# Patient Record
Sex: Male | Born: 1975 | Race: White | Hispanic: No | Marital: Married | Smoking: Never smoker
Health system: Southern US, Community
[De-identification: ages and names within clinical notes are randomized; demographics above are authoritative.]

## PROBLEM LIST (undated history)

## (undated) DIAGNOSIS — J45909 Unspecified asthma, uncomplicated: Secondary | ICD-10-CM

---

## 2017-04-24 ENCOUNTER — Other Ambulatory Visit: Payer: Self-pay

## 2017-04-24 ENCOUNTER — Emergency Department (HOSPITAL_BASED_OUTPATIENT_CLINIC_OR_DEPARTMENT_OTHER): Payer: 59

## 2017-04-24 ENCOUNTER — Emergency Department (HOSPITAL_BASED_OUTPATIENT_CLINIC_OR_DEPARTMENT_OTHER)
Admission: EM | Admit: 2017-04-24 | Discharge: 2017-04-24 | Disposition: A | Discharge status: Home / Self Care | Payer: 59 | Attending: Emergency Medicine | Admitting: Emergency Medicine

## 2017-04-24 ENCOUNTER — Encounter (HOSPITAL_BASED_OUTPATIENT_CLINIC_OR_DEPARTMENT_OTHER): Payer: Self-pay | Admitting: Adult Health

## 2017-04-24 DIAGNOSIS — R0602 Shortness of breath: Secondary | ICD-10-CM | POA: Diagnosis not present

## 2017-04-24 DIAGNOSIS — M791 Myalgia, unspecified site: Secondary | ICD-10-CM | POA: Insufficient documentation

## 2017-04-24 DIAGNOSIS — R112 Nausea with vomiting, unspecified: Secondary | ICD-10-CM | POA: Diagnosis not present

## 2017-04-24 DIAGNOSIS — B9789 Other viral agents as the cause of diseases classified elsewhere: Secondary | ICD-10-CM | POA: Diagnosis not present

## 2017-04-24 DIAGNOSIS — J45909 Unspecified asthma, uncomplicated: Secondary | ICD-10-CM | POA: Insufficient documentation

## 2017-04-24 DIAGNOSIS — R05 Cough: Secondary | ICD-10-CM | POA: Diagnosis present

## 2017-04-24 DIAGNOSIS — F17228 Nicotine dependence, chewing tobacco, with other nicotine-induced disorders: Secondary | ICD-10-CM | POA: Insufficient documentation

## 2017-04-24 DIAGNOSIS — J069 Acute upper respiratory infection, unspecified: Secondary | ICD-10-CM | POA: Insufficient documentation

## 2017-04-24 DIAGNOSIS — R21 Rash and other nonspecific skin eruption: Secondary | ICD-10-CM | POA: Insufficient documentation

## 2017-04-24 DIAGNOSIS — B354 Tinea corporis: Secondary | ICD-10-CM | POA: Diagnosis not present

## 2017-04-24 DIAGNOSIS — R0981 Nasal congestion: Secondary | ICD-10-CM | POA: Diagnosis not present

## 2017-04-24 HISTORY — DX: Unspecified asthma, uncomplicated: J45.909

## 2017-04-24 LAB — BASIC METABOLIC PANEL
ANION GAP: 8 (ref 5–15)
BUN: 8 mg/dL (ref 6–20)
CALCIUM: 8.8 mg/dL — AB (ref 8.9–10.3)
CO2: 26 mmol/L (ref 22–32)
Chloride: 104 mmol/L (ref 101–111)
Creatinine, Ser: 1.02 mg/dL (ref 0.61–1.24)
GFR calc non Af Amer: >60 mL/min (ref >60)
Glucose, Bld: 101 mg/dL — ABNORMAL HIGH (ref 65–99)
Potassium: 3.9 mmol/L (ref 3.5–5.1)
SODIUM: 138 mmol/L (ref 135–145)

## 2017-04-24 LAB — CBC
HCT: 44.7 % (ref 39.0–52.0)
Hemoglobin: 15.9 g/dL (ref 13.0–17.0)
MCH: 30.1 pg (ref 26.0–34.0)
MCHC: 35.6 g/dL (ref 30.0–36.0)
MCV: 84.7 fL (ref 78.0–100.0)
Platelets: 281 10*3/uL (ref 150–400)
RBC: 5.28 MIL/uL (ref 4.22–5.81)
RDW: 12.5 % (ref 11.5–15.5)
WBC: 11.2 10*3/uL — AB (ref 4.0–10.5)

## 2017-04-24 LAB — TROPONIN I: Troponin I: <0.03 ng/mL (ref <0.03)

## 2017-04-24 MED ORDER — ALBUTEROL SULFATE HFA 108 (90 BASE) MCG/ACT IN AERS: 2.0000 | INHALATION_SPRAY | RESPIRATORY_TRACT | 0 refills | Status: AC | PRN

## 2017-04-24 MED ORDER — ALBUTEROL SULFATE (2.5 MG/3ML) 0.083% IN NEBU
5.0000 mg | INHALATION_SOLUTION | Freq: Once | RESPIRATORY_TRACT | Status: AC
  Administered 2017-04-24: 5 mg via RESPIRATORY_TRACT
  Filled 2017-04-24: qty 6

## 2017-04-24 MED ORDER — ACETAMINOPHEN 500 MG PO TABS
1000.0000 mg | ORAL_TABLET | Freq: Once | ORAL | Status: AC
  Administered 2017-04-24: 1000 mg via ORAL
  Filled 2017-04-24: qty 2

## 2017-04-24 MED ORDER — CLOTRIMAZOLE 1 % EX CREA: TOPICAL_CREAM | CUTANEOUS | 0 refills | Status: AC

## 2017-04-24 MED ORDER — SODIUM CHLORIDE 0.9 % IV BOLUS (SEPSIS)
1000.0000 mL | Freq: Once | INTRAVENOUS | Status: AC
  Administered 2017-04-24: 1000 mL via INTRAVENOUS

## 2017-04-24 NOTE — Discharge Instructions (Addendum)
Your evaluation today has been reassuring, you may continue to use albuterol as needed for shortness of breath.  You likely have a viral upper respiratory infection, you can treat this symptomatically, recommend using Zyrtec and Flonase to help with nasal congestion.  Make sure you are drinking plenty of fluids.  If you develop persistent fevers, chest pain or shortness of breath, persistent vomiting or other new or concerning symptoms please return to the ED.  Otherwise please use the phone number provided at the end of your paperwork to establish care with a primary doctor, who can follow-up with if symptoms are not resolving after 5-7 days.  Please also follow-up with this primary doctor regarding your blood pressure as it has been slightly elevated throughout your visit today.  Your rash is likely ringworm, you can try applying Lotrimin twice daily, if this is making the rash worse or is not improving please follow-up with primary care.

## 2017-04-24 NOTE — ED Provider Notes (Signed)
[facility] EMERGENCY DEPARTMENT Provider Note   CSN: [id number] Arrival date & time: 04/24/17  1126     History   Chief Complaint Chief Complaint  Patient presents with  . Cough    HPI  Sean Singh is a 42 y.o. Male history of asthma, presents to the ED complaining of cough, congestion, body aches, nausea, vomiting and sinus pressure.  Reports cough once again became productive of lots of phlegm, no blood in the sputum.  Reports he had one nosebleed on Wednesday but none since then.  Patient reports he initially had the symptoms about 3 weeks ago, and everything but the cough improved until this past Wednesday when patient began to experience the symptoms again.  Patient denies fevers or chills.  Reports emesis is typically after a coughing spell, patient denies abdominal pain, diarrhea or blood in the stool.  He reports despite occasional vomiting he has been able to keep down fluid and fluids.  He denies any chest pain, or pain with deep breaths, but patient does report since Wednesday he has been short of breath, especially with exertion like when he is trying to do something or walk up a set of stairs.  Patient denies any syncope, lightheadedness or dizziness.  Wife also notes that blood pressure has been elevated recently.  Patient does have history of asthma, but has not had to use an inhaler received treatment for this since he was a child.  No history of smoking. Denies lower extremity pain or swelling, recent travel or immobilization, history of PE or DVT, family or personal history of bleeding or clotting disorders or hemoptysis.  Patient also reports he has had a rash for several months, and initially started over his right shoulder, with several small circular lesions, it has now spread across to the other shoulder with some lesions on his arm as well.  Patient reports he initially saw a [facility] provider who thought this might be ringworm and prescribed him an  antifungal cream, but he ran out of it before the rash fully resolved.       Past Medical History:  Diagnosis Date  . Asthma     There are no active problems to display for this patient.   History reviewed. No pertinent surgical history.     Home Medications    Prior to Admission medications   Medication Sig Start Date End Date Taking? Authorizing Provider  albuterol (PROVENTIL HFA;VENTOLIN HFA) 108 (90 Base) MCG/ACT inhaler Inhale 2 puffs into the lungs every 4 (four) hours as needed for wheezing or shortness of breath. 04/24/17   Dartha LodgeFord, Sayler Mickiewicz N, PA-C  clotrimazole (LOTRIMIN) 1 % cream Apply to affected area 2 times daily 04/24/17   Dartha LodgeFord, Dontravious Camille N, PA-C    Family History History reviewed. No pertinent family history.  Social History Social History   Tobacco Use  . Smoking status: Never Smoker  . Smokeless tobacco: Current User  Substance Use Topics  . Alcohol use: Yes    Frequency: Never  . Drug use: No     Allergies   Keflex [cephalexin]   Review of Systems Review of Systems  Constitutional: Negative for chills and fever.  HENT: Positive for congestion, postnasal drip, rhinorrhea, sinus pressure, sinus pain and sore throat. Negative for ear discharge, ear pain, trouble swallowing and voice change.   Eyes: Negative for discharge, redness and itching.  Respiratory: Positive for cough and shortness of breath. Negative for chest tightness, wheezing and stridor.   Cardiovascular:  Negative for chest pain, palpitations and leg swelling.  Gastrointestinal: Positive for nausea and vomiting. Negative for abdominal pain, blood in stool and diarrhea.  Genitourinary: Negative for dysuria.  Musculoskeletal: Positive for myalgias. Negative for arthralgias.  Skin: Positive for rash.  Neurological: Positive for headaches (Frontal, sinus pressure). Negative for dizziness, weakness, light-headedness and numbness.     Physical Exam Updated Vital Signs BP (!) 146/105    Pulse 92   Temp 98.1 F (36.7 C) (Oral)   Resp 18   Wt 99.8 kg (220 lb)   SpO2 99%   Physical Exam  Constitutional: He appears well-developed and well-nourished. No distress.  HENT:  Head: Normocephalic and atraumatic.  TMs clear with good landmarks, moderate nasal mucosa edema with clear rhinorrhea, posterior oropharynx clear and moist, with some erythema, no edema or exudates, uvula midline, no trismus  Eyes: Right eye exhibits no discharge. Left eye exhibits no discharge.  Neck: Neck supple.  Cardiovascular: Normal rate, regular rhythm and normal heart sounds.  Pulmonary/Chest: Effort normal. No respiratory distress.  Respirations equal and unlabored, patient able to speak in full sentences, there is some scattered faint end expiratory wheezes, lungs otherwise clear to auscultation with good air movement  Abdominal: Soft. Bowel sounds are normal. He exhibits no distension and no mass. There is no tenderness. There is no guarding.  Musculoskeletal: He exhibits no edema or deformity.  Bilateral lower extremities without edema or tenderness to palpation  Lymphadenopathy:    He has no cervical adenopathy.  Neurological: He is alert. Coordination normal.  Skin: Skin is warm and dry. Capillary refill takes less than 2 seconds. Rash noted. He is not diaphoretic.  Several 2-3 cm erythematous annular lesions with central clearing scattered over bilateral shoulders and trunk  Psychiatric: He has a normal mood and affect. His behavior is normal.  Nursing note and vitals reviewed.    ED Treatments / Results  Labs (all labs ordered are listed, but only abnormal results are displayed) Labs Reviewed  CBC - Abnormal; Notable for the following components:      Result Value   WBC 11.2 (*)    All other components within normal limits  BASIC METABOLIC PANEL - Abnormal; Notable for the following components:   Glucose, Bld 101 (*)    Calcium 8.8 (*)    All other components within normal limits    TROPONIN I    EKG ED ECG REPORT   Date: 04/24/2017  Rate: 99   Rhythm: normal sinus rhythm  QRS Axis: normal  Intervals: normal  ST/T Wave abnormalities: normal  Conduction Disutrbances:none  Old EKG Reviewed: No previous comparison available  I have personally reviewed the EKG tracing and agree with the computerized printout as noted.   Radiology Dg Chest 2 View  Result Date: 04/24/2017 CLINICAL DATA:  Three-week history of cough. EXAM: CHEST  2 VIEW COMPARISON:  None. FINDINGS: The heart size and mediastinal contours are within normal limits. Both lungs are clear. The visualized skeletal structures are unremarkable. IMPRESSION: No active cardiopulmonary disease. Electronically Signed   By: Kennith Center M.D.   On: 04/24/2017 12:02    Procedures Procedures (including critical care time)  Medications Ordered in ED Medications  sodium chloride 0.9 % bolus 1,000 mL (0 mLs Intravenous Stopped 04/24/17 1400)  acetaminophen (TYLENOL) tablet 1,000 mg (1,000 mg Oral Given 04/24/17 1308)  albuterol (PROVENTIL) (2.5 MG/3ML) 0.083% nebulizer solution 5 mg (5 mg Nebulization Given 04/24/17 1330)     Initial Impression / Assessment and  Plan / ED Course  I have reviewed the triage vital signs and the nursing notes.  Pertinent labs & imaging results that were available during my care of the patient were reviewed by me and considered in my medical decision making (see chart for details).  Patient presents for evaluation of worsening upper respiratory symptoms since Wednesday, patient also reports some exertional dyspnea, no associated chest pain, lightheadedness or syncope.  On initial exam patient is overall well-appearing, mildly tachycardic and hypertensive, but vitals otherwise normal.  Exam suggestive of URI.  Patient does have some mild end expiratory wheezes on exam, does have a history of asthma but has not had any exacerbations in years.  Will give albuterol nebulizer and get chest  x-ray.  No lower extremity edema, patient is PERC negative, low suspicion for PE.  Given the patient has had dyspnea on exertion will get basic labs, troponin, EKG.  Will also give patient fluids and Tylenol.  Patient does have rash suggestive of tinea corporis, reports it was previously improving with antifungal cream.  Will prescribe Lotrimin, but stressed importance of outpatient follow-up.  Chest x-ray without any evidence of pneumonia or other active cardiopulmonary disease.  EKG shows normal sinus rhythm, not concerning for ischemia, troponin negative, very mild leukocytosis, hemoglobin normal, no electrolyte derangements requiring intervention and kidney function is normal.  Valuation has been very reassuring.  On reevaluation patient reports he is feeling much better after albuterol and fluids.  Discussed results with patient and that cardiac workup has been reassuring.  At this time patient is stable for discharge home with prescriptions for albuterol inhaler and Lotrimin cream.  Also recommended using Flonase and Zyrtec to help with nasal congestion.  Ibuprofen and Tylenol as needed.  Counseled patient to drink plenty of fluids.  Patient will need to establish care with a primary care doctor for follow-up, as well as a blood pressure check as patient was mildly hypertensive throughout today's visit.  Return precautions discussed.  Patient and family expressed understanding and are in agreement with plan.  Final Clinical Impressions(s) / ED Diagnoses   Final diagnoses:  Shortness of breath  Viral URI with cough  Tinea corporis    ED Discharge Orders        Ordered    albuterol (PROVENTIL HFA;VENTOLIN HFA) 108 (90 Base) MCG/ACT inhaler  Every 4 hours PRN     04/24/17 1530    clotrimazole (LOTRIMIN) 1 % cream     04/24/17 1530       Jodi Geralds [place], [place] 04/24/17 1657    Benjiman Core, MD 04/25/17 2311

## 2017-04-24 NOTE — ED Triage Notes (Signed)
Presents with cough, congestion, body aches, nausea, vomiting and sinus pressure. The illness began about 3 weeks ago but got worse LAst Wednesday. He denies fevers. Endorses nose bleeds as well. Last emesis last night. He is able to drink and hold fluids down. Denies diarrhea and fevers.

## 2019-02-01 IMAGING — DX DG CHEST 2V
2 series · 2 of 2 positions shown · non-contrast
Comparison: None.

CLINICAL DATA: Three-week history of cough.

EXAM:
CHEST  2 VIEW

[chest pa]
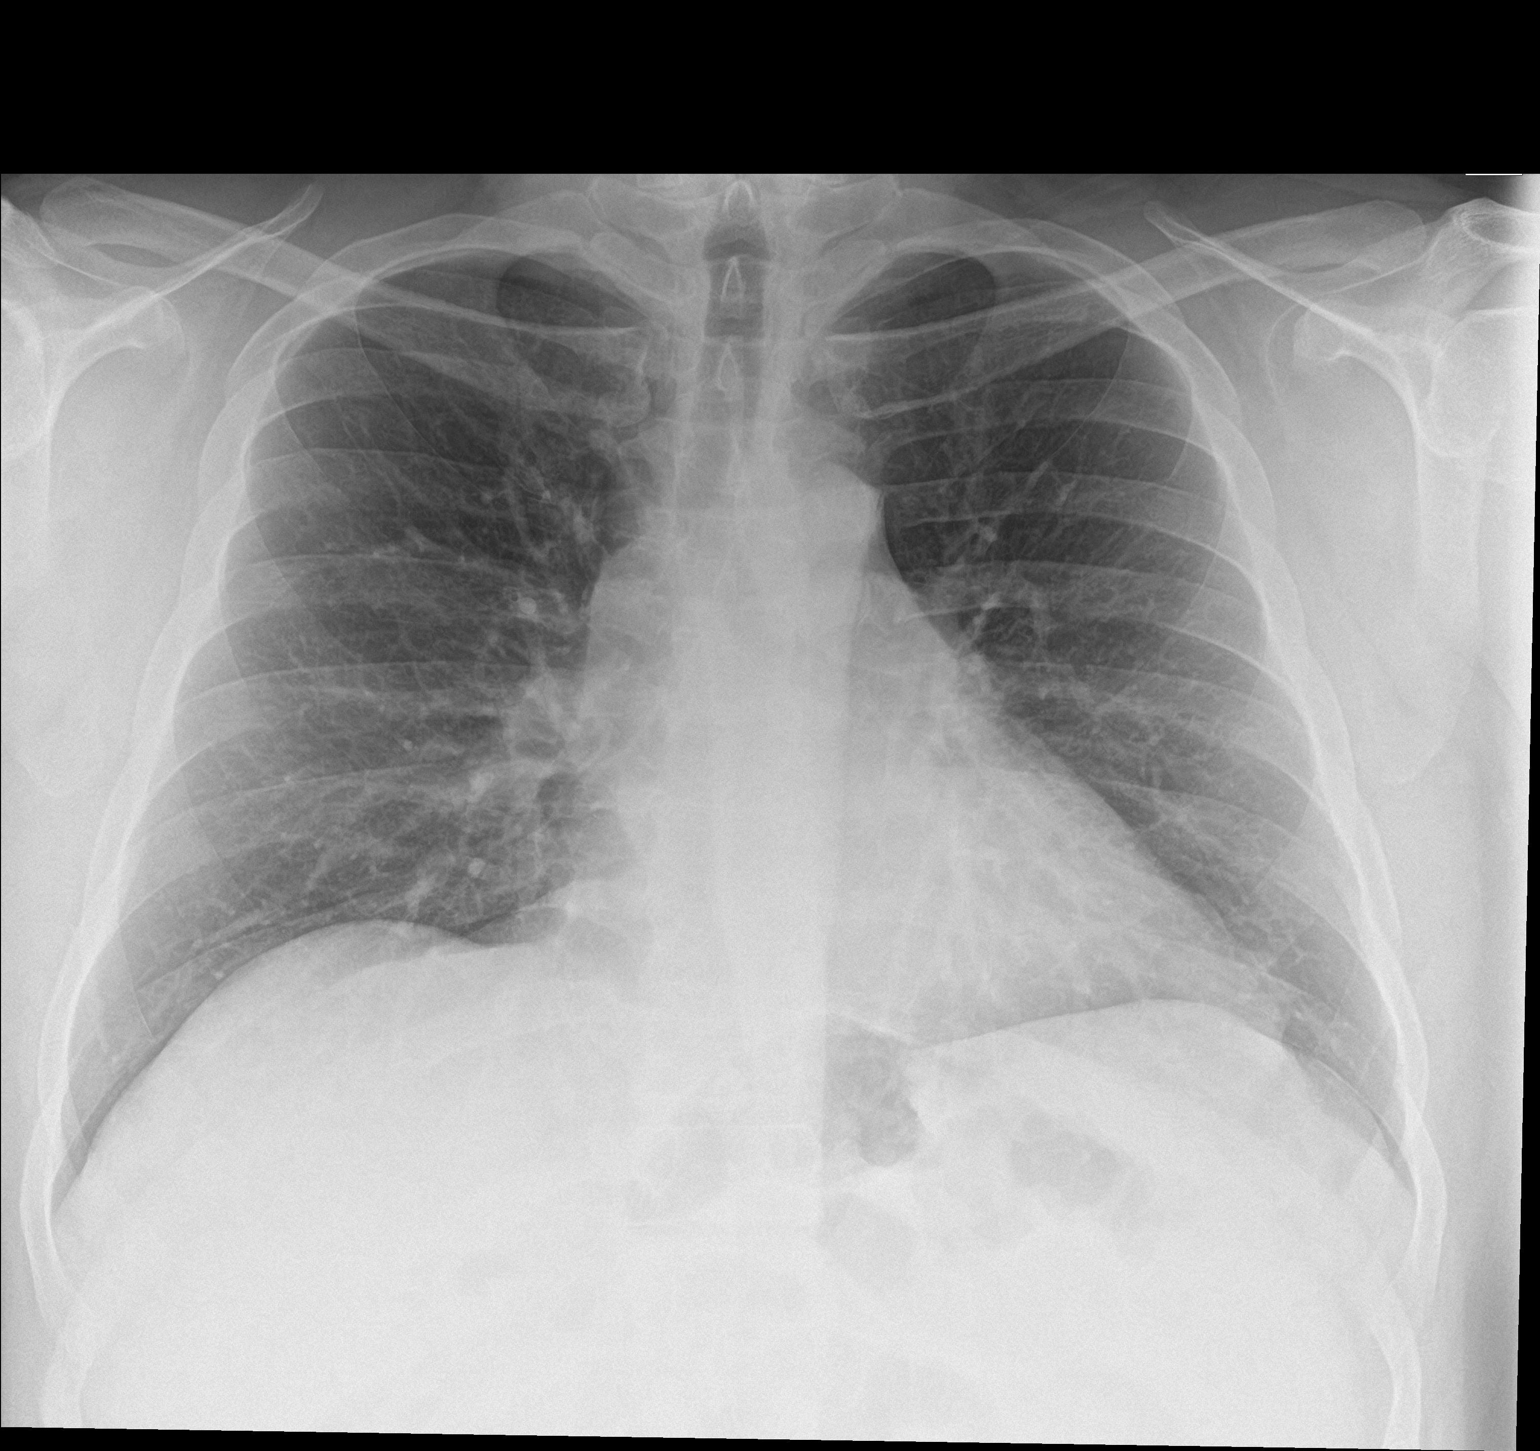

[chest lat]
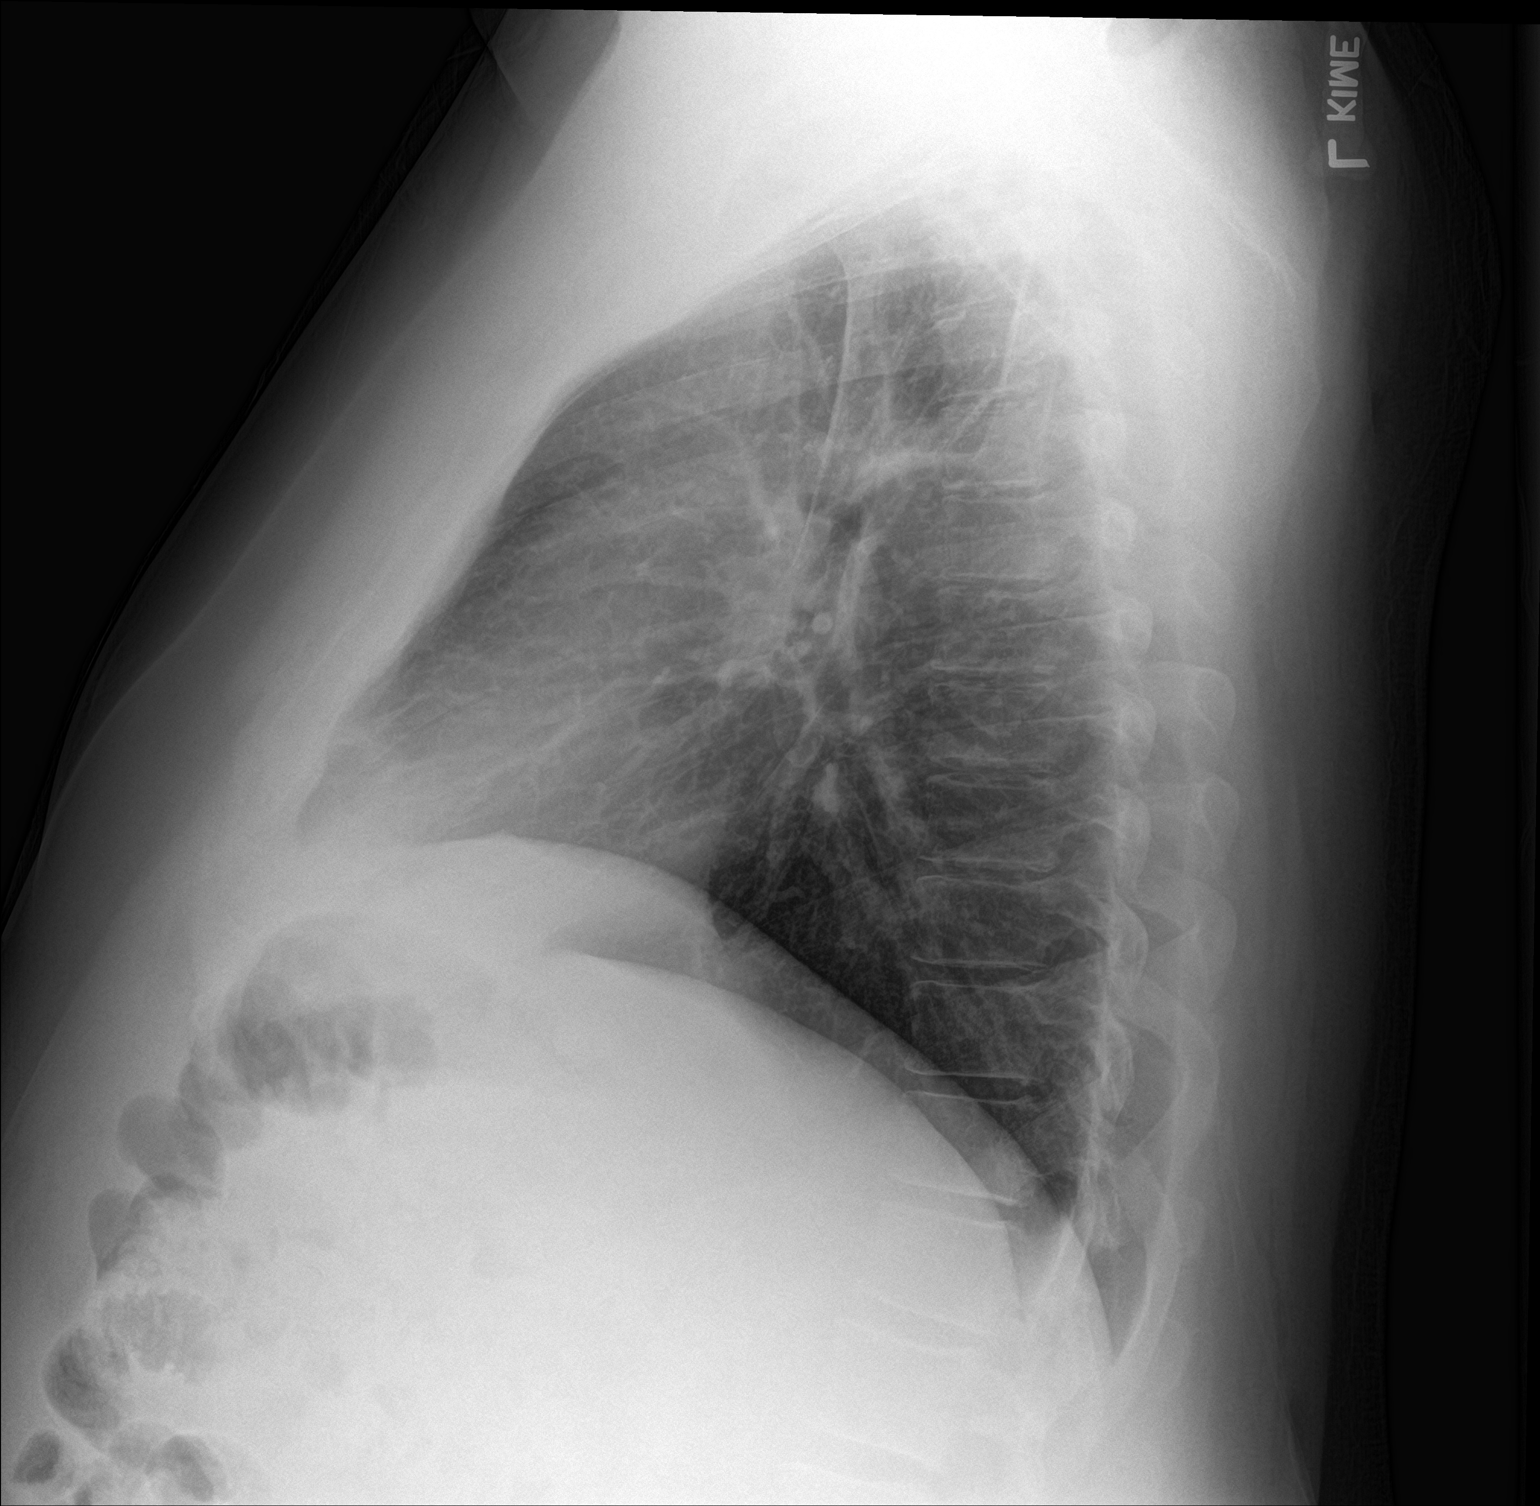

[2 of 2 positions shown; findings below may reference images not displayed]

FINDINGS: The heart size and mediastinal contours are within normal limits.
Both lungs are clear. The visualized skeletal structures are
unremarkable.
IMPRESSION: No active cardiopulmonary disease.
